# Patient Record
Sex: Female | Born: 1985 | Race: White | Hispanic: No | Marital: Married | Smoking: Never smoker
Health system: Southern US, Community
[De-identification: ages and names within clinical notes are randomized; demographics above are authoritative.]

## PROBLEM LIST (undated history)

## (undated) ENCOUNTER — Inpatient Hospital Stay (HOSPITAL_COMMUNITY): Payer: Self-pay

## (undated) DIAGNOSIS — I959 Hypotension, unspecified: Secondary | ICD-10-CM

## (undated) HISTORY — PX: DILATION AND CURETTAGE OF UTERUS: SHX78

---
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
## 2008-04-16 ENCOUNTER — Encounter (INDEPENDENT_AMBULATORY_CARE_PROVIDER_SITE_OTHER): Payer: Self-pay | Admitting: Obstetrics and Gynecology

## 2008-04-16 ENCOUNTER — Ambulatory Visit: Admission: AD | Admit: 2008-04-16 | Discharge: 2008-04-16 | Payer: Self-pay | Admitting: Obstetrics and Gynecology

## 2009-07-07 ENCOUNTER — Inpatient Hospital Stay (HOSPITAL_COMMUNITY): Admission: AD | Admit: 2009-07-07 | Discharge: 2009-07-07 | Payer: Self-pay | Admitting: Obstetrics and Gynecology

## 2009-07-07 ENCOUNTER — Ambulatory Visit: Payer: Self-pay | Admitting: Obstetrics and Gynecology

## 2009-08-06 ENCOUNTER — Inpatient Hospital Stay (HOSPITAL_COMMUNITY): Admission: AD | Admit: 2009-08-06 | Discharge: 2009-08-09 | Payer: Self-pay | Admitting: Obstetrics and Gynecology

## 2009-08-20 ENCOUNTER — Ambulatory Visit: Admission: RE | Admit: 2009-08-20 | Discharge: 2009-08-20 | Payer: Self-pay | Admitting: Obstetrics and Gynecology

## 2010-07-08 LAB — CBC
HCT: 32 % — ABNORMAL LOW (ref 36.0–46.0)
HCT: 34.5 % — ABNORMAL LOW (ref 36.0–46.0)
Hemoglobin: 10.6 g/dL — ABNORMAL LOW (ref 12.0–15.0)
Hemoglobin: 11.5 g/dL — ABNORMAL LOW (ref 12.0–15.0)
MCHC: 33.1 g/dL (ref 30.0–36.0)
MCHC: 33.3 g/dL (ref 30.0–36.0)
MCV: 79.6 fL (ref 78.0–100.0)
MCV: 80.7 fL (ref 78.0–100.0)
Platelets: 190 10*3/uL (ref 150–400)
Platelets: 217 10*3/uL (ref 150–400)
RBC: 3.97 MIL/uL (ref 3.87–5.11)
RBC: 4.34 MIL/uL (ref 3.87–5.11)
RDW: 14.4 % (ref 11.5–15.5)
RDW: 14.8 % (ref 11.5–15.5)
WBC: 11.3 10*3/uL — ABNORMAL HIGH (ref 4.0–10.5)
WBC: 17.7 10*3/uL — ABNORMAL HIGH (ref 4.0–10.5)

## 2010-07-08 LAB — RPR: RPR Ser Ql: NONREACTIVE

## 2010-07-29 ENCOUNTER — Inpatient Hospital Stay (HOSPITAL_COMMUNITY)
Admission: AD | Admit: 2010-07-29 | Discharge: 2010-07-29 | Disposition: A | Discharge status: Home / Self Care | Payer: BC Managed Care – PPO | Source: Ambulatory Visit | Attending: Obstetrics and Gynecology | Admitting: Obstetrics and Gynecology

## 2010-07-29 DIAGNOSIS — O4700 False labor before 37 completed weeks of gestation, unspecified trimester: Secondary | ICD-10-CM | POA: Insufficient documentation

## 2010-07-29 DIAGNOSIS — O479 False labor, unspecified: Secondary | ICD-10-CM

## 2010-07-29 LAB — URINALYSIS, ROUTINE W REFLEX MICROSCOPIC
Bilirubin Urine: NEGATIVE
Glucose, UA: NEGATIVE mg/dL
Hgb urine dipstick: NEGATIVE
Ketones, ur: NEGATIVE mg/dL
Nitrite: NEGATIVE
Protein, ur: NEGATIVE mg/dL
Specific Gravity, Urine: <1.005 — ABNORMAL LOW (ref 1.005–1.030)
Urobilinogen, UA: 0.2 mg/dL (ref 0.0–1.0)
pH: 5.5 (ref 5.0–8.0)

## 2010-07-29 LAB — FETAL FIBRONECTIN: Fetal Fibronectin: NEGATIVE

## 2010-07-29 LAB — URINE MICROSCOPIC-ADD ON

## 2010-07-31 LAB — URINE CULTURE

## 2010-08-04 ENCOUNTER — Inpatient Hospital Stay (HOSPITAL_COMMUNITY)
Admission: AD | Admit: 2010-08-04 | Discharge: 2010-08-05 | Disposition: A | Discharge status: Home / Self Care | Payer: BC Managed Care – PPO | Source: Ambulatory Visit | Attending: Obstetrics and Gynecology | Admitting: Obstetrics and Gynecology

## 2010-08-04 DIAGNOSIS — N390 Urinary tract infection, site not specified: Secondary | ICD-10-CM

## 2010-08-04 DIAGNOSIS — O2390 Unspecified genitourinary tract infection in pregnancy, unspecified trimester: Secondary | ICD-10-CM | POA: Insufficient documentation

## 2010-08-04 DIAGNOSIS — O4700 False labor before 37 completed weeks of gestation, unspecified trimester: Secondary | ICD-10-CM

## 2010-08-05 ENCOUNTER — Inpatient Hospital Stay (HOSPITAL_COMMUNITY)
Admission: AD | Admit: 2010-08-05 | Discharge: 2010-08-05 | Disposition: A | Discharge status: Home / Self Care | Payer: BC Managed Care – PPO | Source: Ambulatory Visit | Attending: Obstetrics and Gynecology | Admitting: Obstetrics and Gynecology

## 2010-08-05 DIAGNOSIS — O4700 False labor before 37 completed weeks of gestation, unspecified trimester: Secondary | ICD-10-CM | POA: Insufficient documentation

## 2010-08-05 LAB — URINE MICROSCOPIC-ADD ON

## 2010-08-05 LAB — URINALYSIS, ROUTINE W REFLEX MICROSCOPIC
Bilirubin Urine: NEGATIVE
Glucose, UA: NEGATIVE mg/dL
Hgb urine dipstick: NEGATIVE
Specific Gravity, Urine: 1.015 (ref 1.005–1.030)
pH: 6.5 (ref 5.0–8.0)

## 2010-08-06 ENCOUNTER — Inpatient Hospital Stay (HOSPITAL_COMMUNITY)
Admission: AD | Admit: 2010-08-06 | Discharge: 2010-08-07 | Disposition: A | Discharge status: Home / Self Care | Payer: BC Managed Care – PPO | Source: Ambulatory Visit | Attending: Obstetrics and Gynecology | Admitting: Obstetrics and Gynecology

## 2010-08-06 DIAGNOSIS — O4700 False labor before 37 completed weeks of gestation, unspecified trimester: Secondary | ICD-10-CM

## 2010-08-06 DIAGNOSIS — R109 Unspecified abdominal pain: Secondary | ICD-10-CM | POA: Insufficient documentation

## 2010-08-06 LAB — URINE CULTURE

## 2010-08-29 ENCOUNTER — Inpatient Hospital Stay (HOSPITAL_COMMUNITY)
Admission: AD | Admit: 2010-08-29 | Discharge: 2010-08-29 | Disposition: A | Discharge status: Home / Self Care | Payer: BC Managed Care – PPO | Source: Ambulatory Visit | Attending: Obstetrics and Gynecology | Admitting: Obstetrics and Gynecology

## 2010-08-29 DIAGNOSIS — O479 False labor, unspecified: Secondary | ICD-10-CM | POA: Insufficient documentation

## 2010-09-02 NOTE — Op Note (Signed)
NAME:  Emily Bean, Emily Bean NO.:  [id number]   MEDICAL RECORD NO.:  [id number]          PATIENT TYPE:  AMB   LOCATION:  DFT[facility]                          FACILITY:  [facility]   PHYSICIAN:  Juluis Mire, M.D.   DATE OF BIRTH:  [birth year]   DATE OF PROCEDURE:  DATE OF DISCHARGE:                               OPERATIVE REPORT   PREOPERATIVE DIAGNOSIS:  Incomplete spontaneous abortion.   POSTOPERATIVE DIAGNOSIS:  Incomplete spontaneous abortion.   PROCEDURE:  Dilatation and evacuation.   ANESTHESIA:  Sedation with paracervical block.   ESTIMATED BLOOD LOSS:  Minimal.   PACKS AND DRAINS:  None.   INTRAOPERATIVE BLOOD REPLACED:  None.   COMPLICATIONS:  None.   INDICATIONS:  A 25 year old primigravida female, had been seen in the  office over the past week with bleeding.  Initial ultrasound revealed a  fetal pole with a slow heart activity.  She was seen again today with a  fetal pole with no heart activity, was set up and D&E on Wednesday.  She  presented tonight with increasing bleeding and cramping.  She was having  moderate bleeding.  Decision was proceed with dilatation and evacuation.  The risks were explained, including the risks of infection.  The risk of  vascular injury could lead to hemorrhage requiring transfusion with the  risk of AIDS or hepatitis.  Excessive bleeding could require  hysterectomy.  Also continued bleeding could be from retained products  requiring repeat D&C.  There is a risk of uterine perforation leading to  injury to adjacent __________ organs requiring further exploratory  surgery.  Risk of deep venous thrombosis and pulmonary emboli.  The  patient does understand potential risks and complications.   DESCRIPTION OF PROCEDURE:  The patient was taken to OR, placed supine  position.  After sedation, was placed in dorsal lying position using  Allen stirrups.  The patient draped in sterile field.  Speculum was  placed in vaginal vault.  Cervix  and vagina cleansed with Betadine.  Moderate tissue was seen extruding from the cervix.  It was removed and  sent for Pathology.  Cervix grasped with single tooth tenaculum.  Paracervical blocks ensued using 1% __________.  Uterus sounded to about  9 cm.  Cervix is already dilated.  Size 8 curved cut.  Suction curette  was introduced and minimal tissue was obtained.  It was felt that most  of tissue had already passed is noted on initial exam.  We sharply  curetted the uterus.  Uterus was contracting down a lot well.  All  quadrants were gritty.  There was no evidence of retained products.  Repeat suction curetting, sharp curetting again no additional tissue was  obtained and the uterus was contracting down well.  No perforation had  occurred.  Single tooth tenaculum speculum removed.  The patient taken  out of the dorsal position, once alert, transferred recovery room in  good condition.  Sponge, instrument, needle count reported as correct by  circulating nurse.      Juluis Mire, M.D.  Electronically Signed     JSM/MEDQ  D:  04/16/2008  T:  04/17/2008  Job:  [id number]

## 2010-09-07 ENCOUNTER — Inpatient Hospital Stay (HOSPITAL_COMMUNITY)
Admission: AD | Admit: 2010-09-07 | Discharge: 2010-09-10 | DRG: 373 | Disposition: A | Discharge status: Home / Self Care | Payer: BC Managed Care – PPO | Source: Ambulatory Visit | Attending: Obstetrics and Gynecology | Admitting: Obstetrics and Gynecology

## 2010-09-07 DIAGNOSIS — O3660X0 Maternal care for excessive fetal growth, unspecified trimester, not applicable or unspecified: Principal | ICD-10-CM | POA: Diagnosis present

## 2010-09-07 LAB — CBC
HCT: 34.1 % — ABNORMAL LOW (ref 36.0–46.0)
Hemoglobin: 11.1 g/dL — ABNORMAL LOW (ref 12.0–15.0)
MCH: 24.6 pg — ABNORMAL LOW (ref 26.0–34.0)
MCHC: 32.6 g/dL (ref 30.0–36.0)
RBC: 4.51 MIL/uL (ref 3.87–5.11)

## 2010-09-09 LAB — CBC
HCT: 31.4 % — ABNORMAL LOW (ref 36.0–46.0)
Hemoglobin: 10 g/dL — ABNORMAL LOW (ref 12.0–15.0)
MCHC: 31.8 g/dL (ref 30.0–36.0)
MCV: 76.6 fL — ABNORMAL LOW (ref 78.0–100.0)
RDW: 15.3 % (ref 11.5–15.5)
WBC: 12.6 10*3/uL — ABNORMAL HIGH (ref 4.0–10.5)

## 2011-01-23 LAB — CBC
Hemoglobin: 14.4 g/dL (ref 12.0–15.0)
MCHC: 34.4 g/dL (ref 30.0–36.0)
MCV: 88.4 fL (ref 78.0–100.0)
RDW: 13.2 % (ref 11.5–15.5)

## 2011-02-11 ENCOUNTER — Emergency Department (HOSPITAL_COMMUNITY)
Admission: EM | Admit: 2011-02-11 | Discharge: 2011-02-11 | Disposition: A | Discharge status: Home / Self Care | Payer: BC Managed Care – PPO | Attending: Emergency Medicine | Admitting: Emergency Medicine

## 2011-02-11 DIAGNOSIS — R110 Nausea: Secondary | ICD-10-CM | POA: Insufficient documentation

## 2011-02-11 DIAGNOSIS — R42 Dizziness and giddiness: Secondary | ICD-10-CM | POA: Insufficient documentation

## 2011-02-11 DIAGNOSIS — E860 Dehydration: Secondary | ICD-10-CM | POA: Insufficient documentation

## 2011-02-11 LAB — POCT I-STAT, CHEM 8
BUN: 21 mg/dL (ref 6–23)
Calcium, Ion: 1.08 mmol/L — ABNORMAL LOW (ref 1.12–1.32)
Chloride: 107 mEq/L (ref 96–112)
Creatinine, Ser: 0.8 mg/dL (ref 0.50–1.10)
Glucose, Bld: 92 mg/dL (ref 70–99)
Potassium: 4.9 mEq/L (ref 3.5–5.1)

## 2011-02-11 LAB — URINALYSIS, ROUTINE W REFLEX MICROSCOPIC
Bilirubin Urine: NEGATIVE
Glucose, UA: NEGATIVE mg/dL
Hgb urine dipstick: NEGATIVE
Ketones, ur: NEGATIVE mg/dL
Nitrite: NEGATIVE
Specific Gravity, Urine: 1.004 — ABNORMAL LOW (ref 1.005–1.030)
pH: 6 (ref 5.0–8.0)

## 2011-02-11 LAB — POCT PREGNANCY, URINE: Preg Test, Ur: NEGATIVE

## 2012-01-29 ENCOUNTER — Inpatient Hospital Stay (HOSPITAL_COMMUNITY)
Admission: AD | Admit: 2012-01-29 | Discharge: 2012-01-29 | Disposition: A | Discharge status: Home / Self Care | Payer: BC Managed Care – PPO | Source: Ambulatory Visit | Attending: Family Medicine | Admitting: Family Medicine

## 2012-01-29 ENCOUNTER — Encounter (HOSPITAL_COMMUNITY): Payer: Self-pay

## 2012-01-29 DIAGNOSIS — R51 Headache: Secondary | ICD-10-CM | POA: Insufficient documentation

## 2012-01-29 DIAGNOSIS — R519 Headache, unspecified: Secondary | ICD-10-CM | POA: Diagnosis present

## 2012-01-29 DIAGNOSIS — A084 Viral intestinal infection, unspecified: Secondary | ICD-10-CM | POA: Diagnosis present

## 2012-01-29 DIAGNOSIS — O26899 Other specified pregnancy related conditions, unspecified trimester: Secondary | ICD-10-CM

## 2012-01-29 DIAGNOSIS — O99891 Other specified diseases and conditions complicating pregnancy: Secondary | ICD-10-CM | POA: Insufficient documentation

## 2012-01-29 DIAGNOSIS — O210 Mild hyperemesis gravidarum: Secondary | ICD-10-CM | POA: Insufficient documentation

## 2012-01-29 DIAGNOSIS — A088 Other specified intestinal infections: Secondary | ICD-10-CM | POA: Insufficient documentation

## 2012-01-29 HISTORY — DX: Hypotension, unspecified: I95.9

## 2012-01-29 LAB — URINALYSIS, ROUTINE W REFLEX MICROSCOPIC
Bilirubin Urine: NEGATIVE
Glucose, UA: NEGATIVE mg/dL
Hgb urine dipstick: NEGATIVE
Ketones, ur: NEGATIVE mg/dL
Leukocytes, UA: NEGATIVE
Nitrite: NEGATIVE
Protein, ur: NEGATIVE mg/dL
Specific Gravity, Urine: 1.015 (ref 1.005–1.030)
Urobilinogen, UA: 0.2 mg/dL (ref 0.0–1.0)
pH: 6 (ref 5.0–8.0)

## 2012-01-29 LAB — COMPREHENSIVE METABOLIC PANEL
ALT: 22 U/L (ref 0–35)
Alkaline Phosphatase: 53 U/L (ref 39–117)
BUN: 7 mg/dL (ref 6–23)
CO2: 24 mEq/L (ref 19–32)
Calcium: 8.5 mg/dL (ref 8.4–10.5)
GFR calc Af Amer: >90 mL/min (ref >90)
GFR calc non Af Amer: >90 mL/min (ref >90)
Glucose, Bld: 90 mg/dL (ref 70–99)
Sodium: 139 mEq/L (ref 135–145)
Total Protein: 6.1 g/dL (ref 6.0–8.3)

## 2012-01-29 LAB — CBC
HCT: 35.9 % — ABNORMAL LOW (ref 36.0–46.0)
Hemoglobin: 12.4 g/dL (ref 12.0–15.0)
MCH: 28.4 pg (ref 26.0–34.0)
MCHC: 34.5 g/dL (ref 30.0–36.0)
RBC: 4.37 MIL/uL (ref 3.87–5.11)

## 2012-01-29 MED ORDER — BUTALBITAL-APAP-CAFFEINE 50-325-40 MG PO TABS
2.0000 | ORAL_TABLET | ORAL | Status: AC
  Administered 2012-01-29: 2 via ORAL
  Filled 2012-01-29: qty 2

## 2012-01-29 MED ORDER — LACTATED RINGERS IV BOLUS (SEPSIS)
1000.0000 mL | Freq: Once | INTRAVENOUS | Status: AC
  Administered 2012-01-29: 1000 mL via INTRAVENOUS

## 2012-01-29 MED ORDER — BUTALBITAL-APAP-CAFFEINE 50-325-40 MG PO TABS: 1.0000 | ORAL_TABLET | Freq: Four times a day (QID) | ORAL | Status: AC | PRN

## 2012-01-29 NOTE — MAU Provider Note (Signed)
Chief Complaint: Emesis and Headache   First Provider Initiated Contact with Patient 01/29/12 1254     SUBJECTIVE HPI: Emily Bean is a 26 y.o. [id number] pt of [facility] at [redacted]w[redacted]d by LMP who presents to maternity admissions reporting diarrhea and n/v x3 days, improving today but dizziness, ears ringing, and severe headache starting today.  She denies LOF, vaginal bleeding, vaginal itching/burning, urinary symptoms, h/a, dizziness, or fever/chills.     Past Medical History  Diagnosis Date  . Hypotension    Past Surgical History  Procedure Date  . Dilation and curettage of uterus    History   Social History  . Marital Status: Married    Spouse Name: N/A    Number of Children: N/A  . Years of Education: N/A   Occupational History  . Not on file.   Social History Main Topics  . Smoking status: Never Smoker   . Smokeless tobacco: Not on file  . Alcohol Use: No  . Drug Use: No  . Sexually Active: Yes   Other Topics Concern  . Not on file   Social History Narrative  . No narrative on file   No current facility-administered medications on file prior to encounter.   No current outpatient prescriptions on file prior to encounter.   Allergies  Allergen Reactions  . Vicodin (Hydrocodone-Acetaminophen) Hives, Swelling and Rash    ROS: Pertinent items in HPI  OBJECTIVE Blood pressure 105/72, pulse 114, temperature 99.2 F (37.3 C), temperature source Oral, resp. rate 16, height 5' 2.5" (1.588 m), weight 63.685 kg (140 lb 6.4 oz), SpO2 100.00%. GENERAL: Well-developed, well-nourished female in no acute distress.  HEENT: Normocephalic HEART: normal rate, no abnormal heart sounds RESP: normal effort, lung sounds clear and equal bilaterally ABDOMEN: Soft, non-tender EXTREMITIES: Nontender, no edema NEURO: Alert and oriented SPECULUM EXAM: Deferred   LAB RESULTS Recent Results (from the past 168 hour(s))  URINALYSIS, ROUTINE W REFLEX MICROSCOPIC   Collection Time   01/29/12 12:04 PM      Component Value Range   Color, Urine YELLOW  YELLOW   APPearance CLEAR  CLEAR   Specific Gravity, Urine 1.015  1.005 - 1.030   pH 6.0  5.0 - 8.0   Glucose, UA NEGATIVE  NEGATIVE mg/dL   Hgb urine dipstick NEGATIVE  NEGATIVE   Bilirubin Urine NEGATIVE  NEGATIVE   Ketones, ur NEGATIVE  NEGATIVE mg/dL   Protein, ur NEGATIVE  NEGATIVE mg/dL   Urobilinogen, UA 0.2  0.0 - 1.0 mg/dL   Nitrite NEGATIVE  NEGATIVE   Leukocytes, UA NEGATIVE  NEGATIVE  CBC   Collection Time   01/29/12  1:10 PM      Component Value Range   WBC 6.6  4.0 - 10.5 K/uL   RBC 4.37  3.87 - 5.11 MIL/uL   Hemoglobin 12.4  12.0 - 15.0 g/dL   HCT 45.4 (*) 09.8 - 11.9 %   MCV 82.2  78.0 - 100.0 fL   MCH 28.4  26.0 - 34.0 pg   MCHC 34.5  30.0 - 36.0 g/dL   RDW 14.7  82.9 - 56.2 %   Platelets 173  150 - 400 K/uL  COMPREHENSIVE METABOLIC PANEL   Collection Time   01/29/12  1:10 PM      Component Value Range   Sodium 139  135 - 145 mEq/L   Potassium 4.3  3.5 - 5.1 mEq/L   Chloride 103  96 - 112 mEq/L   CO2 24  19 -  32 mEq/L   Glucose, Bld 90  70 - 99 mg/dL   BUN 7  6 - 23 mg/dL   Creatinine, Ser 9.56  0.50 - 1.10 mg/dL   Calcium 8.5  8.4 - 21.3 mg/dL   Total Protein 6.1  6.0 - 8.3 g/dL   Albumin 3.2 (*) 3.5 - 5.2 g/dL   AST 20  0 - 37 U/L   ALT 22  0 - 35 U/L   Alkaline Phosphatase 53  39 - 117 U/L   Total Bilirubin 0.3  0.3 - 1.2 mg/dL   GFR calc non Af Amer >90  >90 mL/min   GFR calc Af Amer >90  >90 mL/min     ASSESSMENT 1. Viral gastroenteritis   2. Headache in pregnancy     PLAN LR x2 and Fioricet x2 tabs in MAU--h/a relieved, pt still reports dizziness so second bag of 1000 ml LR given--pt symptoms resolved Discharge home Fioricet 1-2 tabs Q6 hours PRN for h/a F/U with prenatal provider Drink plenty of fluids Return to MAU as needed    Sharen Counter Certified Nurse-Midwife 01/29/2012  12:56 PM

## 2012-01-29 NOTE — MAU Note (Signed)
Patient states she woke up on 10-9 with diarrhea and vomiting. Thought it was from food. Had one episode of vomiting yesterday but none today. Has a headache and ears ringing. Feels dehydrated. Called her MD in [facility] and was instructed to come her for fluids.

## 2012-02-01 NOTE — MAU Provider Note (Signed)
Chart reviewed and agree with management and plan.  

## 2012-12-03 ENCOUNTER — Encounter (HOSPITAL_COMMUNITY): Payer: Self-pay | Admitting: *Deleted

## 2014-02-19 ENCOUNTER — Encounter (HOSPITAL_COMMUNITY): Payer: Self-pay | Admitting: *Deleted

## 2019-03-20 ENCOUNTER — Other Ambulatory Visit: Payer: Self-pay

## 2019-03-20 DIAGNOSIS — Z20822 Contact with and (suspected) exposure to covid-19: Secondary | ICD-10-CM

## 2019-03-21 LAB — NOVEL CORONAVIRUS, NAA: SARS-CoV-2, NAA: NOT DETECTED

## 2019-11-07 ENCOUNTER — Ambulatory Visit (INDEPENDENT_AMBULATORY_CARE_PROVIDER_SITE_OTHER): Payer: Self-pay

## 2019-11-07 ENCOUNTER — Other Ambulatory Visit: Payer: Self-pay

## 2019-11-07 ENCOUNTER — Ambulatory Visit (INDEPENDENT_AMBULATORY_CARE_PROVIDER_SITE_OTHER): Payer: Self-pay | Admitting: Orthopaedic Surgery

## 2019-11-07 ENCOUNTER — Encounter: Payer: Self-pay | Admitting: Orthopaedic Surgery

## 2019-11-07 DIAGNOSIS — M25562 Pain in left knee: Secondary | ICD-10-CM

## 2019-11-07 MED ORDER — METHYLPREDNISOLONE ACETATE 40 MG/ML IJ SUSP
40.0000 mg | INTRAMUSCULAR | Status: AC | PRN
  Administered 2019-11-07: 40 mg via INTRA_ARTICULAR

## 2019-11-07 MED ORDER — LIDOCAINE HCL 1 % IJ SOLN
3.0000 mL | INTRAMUSCULAR | Status: AC | PRN
Start: 2019-11-07 — End: 2019-11-07
  Administered 2019-11-07: 3 mL

## 2019-11-07 NOTE — Progress Notes (Signed)
Office Visit Note   Patient: Emily Bean           Date of Birth: 12/11/1985           MRN: [id number] Visit Date: 11/07/2019              Requested by: No referring provider defined for this encounter. PCP: Patient, No Pcp Per   Assessment & Plan: Visit Diagnoses:  1. Acute pain of left knee     Plan: We will obtain an MRI of her knee due to the fact that she continues to have mechanical symptoms of the knee been ongoing for greater than 6 months.  MRIs to rule out a meniscal tear.  She will follow-up after the MRI to go over results and discuss further treatment.  Follow-Up Instructions: Return After MRi.   Orders:  Orders Placed This Encounter  Procedures   Large Joint Inj   XR Knee 1-2 Views Left   No orders of the defined types were placed in this encounter.     Procedures: Large Joint Inj: L knee on 11/07/2019 2:30 PM Indications: pain Details: 22 G 1.5 in needle, anterolateral approach  Arthrogram: No  Medications: 3 mL lidocaine 1 %; 40 mg methylPREDNISolone acetate 40 MG/ML Outcome: tolerated well, no immediate complications Procedure, treatment alternatives, risks and benefits explained, specific risks discussed. Consent was given by the patient. Immediately prior to procedure a time out was called to verify the correct patient, procedure, equipment, support staff and site/side marked as required. Patient was prepped and draped in the usual sterile fashion.       Clinical Data: No additional findings.   Subjective: Chief Complaint  Patient presents with   Left Knee - Pain, Injury    HPI Patient is a 34 year old female comes in today with left knee pain.  She reports she slipped on ice back in February of this year and injured the knee describes having valgus stressing of the knee at the time of fall.  She states she continues have pain in the anterior aspect knee and medial joint line region.  She cannot jog or run for exercise due to pain  in the knee.  She unfortunately had a fall 2 weeks ago and whenever she fell in the kitchen while dancing and again valgus stress the knee with follow-up.  She has constant achy pain in the knee she describes no giving way catching or painful popping.  She does have occasional locking-like sensation in the knee.  She has tried Advil and relative rest.  She had no significant swelling in the knee.  Review of Systems  Constitutional: Negative for chills and fever.     Objective: Vital Signs: There were no vitals taken for this visit.  Physical Exam Constitutional:      Appearance: She is not ill-appearing or diaphoretic.  Pulmonary:     Effort: Pulmonary effort is normal.  Neurological:     Mental Status: She is alert and oriented to person, place, and time.  Psychiatric:        Behavior: Behavior normal.     Ortho Exam Left knee: No effusion abnormal warmth or erythema.  Good range of motion left knee tenderness along medial joint line.  Positive McMurray's.  No instability valgus varus stressing. Specialty Comments:  No specialty comments available.  Imaging: XR Knee 1-2 Views Left  Result Date: 11/07/2019 Left knee 2 views: No acute fracture or findings. Knee joint well maintained and knee located.  PMFS History: Patient Active Problem List   Diagnosis Date Noted   Viral gastroenteritis 01/29/2012   Headache in pregnancy 01/29/2012   Past Medical History:  Diagnosis Date   Hypotension     History reviewed. No pertinent family history.  Past Surgical History:  Procedure Laterality Date   DILATION AND CURETTAGE OF UTERUS     Social History   Occupational History   Not on file  Tobacco Use   Smoking status: Never Smoker  Substance and Sexual Activity   Alcohol use: No   Drug use: No   Sexual activity: Yes

## 2019-11-09 ENCOUNTER — Telehealth: Payer: Self-pay | Admitting: Physician Assistant

## 2019-11-09 ENCOUNTER — Other Ambulatory Visit: Payer: Self-pay

## 2019-11-09 DIAGNOSIS — M25562 Pain in left knee: Secondary | ICD-10-CM

## 2019-11-09 NOTE — Telephone Encounter (Signed)
Pt called stating she got a knee injection 11/06/19 and now the pain is x10 worse; pt also states her knee has been locking up. Pt would like a CB  [phone]

## 2019-11-09 NOTE — Telephone Encounter (Signed)
IC s/w patient. Advised she should try ice/anti-inflammatory and elevate. She will follow up with [place] on Monday if no better.

## 2019-11-13 ENCOUNTER — Ambulatory Visit
Admission: RE | Admit: 2019-11-13 | Discharge: 2019-11-13 | Disposition: A | Discharge status: Home / Self Care | Payer: No Typology Code available for payment source | Source: Ambulatory Visit | Attending: Physician Assistant | Admitting: Physician Assistant

## 2019-11-13 ENCOUNTER — Other Ambulatory Visit: Payer: Self-pay

## 2019-11-13 ENCOUNTER — Telehealth: Payer: Self-pay | Admitting: Physician Assistant

## 2019-11-13 DIAGNOSIS — M25562 Pain in left knee: Secondary | ICD-10-CM

## 2019-11-13 NOTE — Telephone Encounter (Signed)
Patient called.   She is wanting to know if she can get a call back with some advice on how to maintain until her MRI review. Her MRI is being done today.  Call back: [phone]

## 2019-11-14 ENCOUNTER — Other Ambulatory Visit: Payer: Self-pay | Admitting: Orthopaedic Surgery

## 2019-11-14 NOTE — Telephone Encounter (Signed)
Patient aware of the below message  She states that she didn't ask for anything or say she was in pain, she just asked if someone was going to call her before the MRI appt, I told her that we don't call the patients with results, we go over them at her appt and she is ok with this

## 2019-11-14 NOTE — Telephone Encounter (Signed)
There is really nothing else she can do right now.  She can try a knee sleeve over-the-counter.  She can take anti-inflammatories over-the-counter as well.  I was going to send in some tramadol but it list serious allergies to narcotics including hives and swelling and so that prohibits [place] from sending any stronger pain medicine.  Certainly activity modification and ice of the knee can help as well.

## 2019-11-14 NOTE — Telephone Encounter (Signed)
See below Her followup appt is 11/22/19

## 2019-11-22 ENCOUNTER — Ambulatory Visit (INDEPENDENT_AMBULATORY_CARE_PROVIDER_SITE_OTHER): Payer: Self-pay | Admitting: Physician Assistant

## 2019-11-22 ENCOUNTER — Encounter: Payer: Self-pay | Admitting: Physician Assistant

## 2019-11-22 DIAGNOSIS — M25562 Pain in left knee: Secondary | ICD-10-CM

## 2019-11-22 MED ORDER — METHYLPREDNISOLONE 4 MG PO TABS: ORAL_TABLET | ORAL | 0 refills | Status: AC

## 2019-11-22 NOTE — Progress Notes (Addendum)
Office Visit Note   Patient: Emily Bean           Date of Birth: 01-08-1986           MRN: [id number] Visit Date: 11/22/2019              Requested by: Porfirio Oar, PA [address] [place],  [place] [address] PCP: Porfirio Oar, PA   Assessment & Plan: Visit Diagnoses:  1. Acute pain of left knee     Plan: We will place her on a Medrol Dosepak.  Then send her to formal physical therapy for strengthening of the knee modalities and a home exercise program.  No high impact activities.  Like to see her back in 6 weeks to see what type of response she has had to this conservative treatment.  She may benefit from a repeat MRI to evaluate the lateral posterior femoral condyle edema.  She fails conservative treatment may consider subchondroplasty.  Questions were encouraged and answered.No high impact activities.   Follow-Up Instructions: Return in about 6 weeks (around 01/03/2020).   Orders:  No orders of the defined types were placed in this encounter.  Meds ordered this encounter  Medications  . methylPREDNISolone (MEDROL) 4 MG tablet    Sig: Take as directed    Dispense:  21 tablet    Refill:  0      Procedures: No procedures performed   Clinical Data: No additional findings.   Subjective: Chief Complaint  Patient presents with  . Left Knee - Follow-up    HPI Mrs. Sutphen returns today to go over the MRI of her left knee.  She states the cortisone injection actually made her pain worse.  She states for about 5 days knee after the injection the knee felt like it was locking up.  She is having pain in the anterior aspect of the knee and the posterior lateral aspect of the ankle. MRI images are reviewed with the patient.  This shows some mild patella chondromalacia.  Large area of focal edema involving the posterior aspect of the lateral femoral condyle focal bony contusion.  No ligamentous or meniscal tears.  Review of Systems See  HPI.  Objective: Vital Signs: There were no vitals taken for this visit.  Physical Exam Constitutional:      Appearance: She is not ill-appearing or diaphoretic.  Pulmonary:     Effort: Pulmonary effort is normal.  Neurological:     Mental Status: She is alert and oriented to person, place, and time.  Psychiatric:        Mood and Affect: Mood normal.     Ortho Exam Physical exam left knee good range of motion.  Tenderness along the lateral joint line posteriorly.  Also some tenderness over the anterior medial aspect of the knee with palpation.  No abnormal warmth erythema or effusion. Specialty Comments:  No specialty comments available.  Imaging: No results found.   PMFS History: Patient Active Problem List   Diagnosis Date Noted  . Viral gastroenteritis 01/29/2012  . Headache in pregnancy 01/29/2012   Past Medical History:  Diagnosis Date  . Hypotension     History reviewed. No pertinent family history.  Past Surgical History:  Procedure Laterality Date  . DILATION AND CURETTAGE OF UTERUS     Social History   Occupational History  . Not on file  Tobacco Use  . Smoking status: Never Smoker  Substance and Sexual Activity  . Alcohol use: No  .  Drug use: No  . Sexual activity: Yes

## 2019-11-23 ENCOUNTER — Other Ambulatory Visit: Payer: Self-pay

## 2019-12-10 ENCOUNTER — Encounter (HOSPITAL_COMMUNITY): Payer: Self-pay

## 2019-12-10 ENCOUNTER — Emergency Department (HOSPITAL_COMMUNITY)
Admission: EM | Admit: 2019-12-10 | Discharge: 2019-12-10 | Disposition: A | Discharge status: Home / Self Care | Payer: Self-pay | Attending: Emergency Medicine | Admitting: Emergency Medicine

## 2019-12-10 ENCOUNTER — Other Ambulatory Visit: Payer: Self-pay

## 2019-12-10 DIAGNOSIS — T171XXA Foreign body in nostril, initial encounter: Secondary | ICD-10-CM | POA: Insufficient documentation

## 2019-12-10 DIAGNOSIS — Z91040 Latex allergy status: Secondary | ICD-10-CM | POA: Insufficient documentation

## 2019-12-10 DIAGNOSIS — Y929 Unspecified place or not applicable: Secondary | ICD-10-CM | POA: Insufficient documentation

## 2019-12-10 DIAGNOSIS — W458XXA Other foreign body or object entering through skin, initial encounter: Secondary | ICD-10-CM | POA: Insufficient documentation

## 2019-12-10 DIAGNOSIS — Y939 Activity, unspecified: Secondary | ICD-10-CM | POA: Insufficient documentation

## 2019-12-10 DIAGNOSIS — Y999 Unspecified external cause status: Secondary | ICD-10-CM | POA: Insufficient documentation

## 2019-12-10 NOTE — ED Provider Notes (Signed)
[facility] EMERGENCY DEPARTMENT Provider Note   CSN: [id number] Arrival date & time: 12/10/19  1238     History No chief complaint on file.   Emily Bean is a 34 y.o. female.  The history is provided by the patient.  Foreign Body in Nose This is a new problem. The current episode started yesterday. The problem occurs rarely. The problem has not changed since onset.Pertinent negatives include no chest pain, no abdominal pain, no headaches and no shortness of breath. She has tried nothing for the symptoms. The treatment provided no relief.       Past Medical History:  Diagnosis Date  . Hypotension     Patient Active Problem List   Diagnosis Date Noted  . Viral gastroenteritis 01/29/2012  . Headache in pregnancy 01/29/2012    Past Surgical History:  Procedure Laterality Date  . DILATION AND CURETTAGE OF UTERUS       OB History    Gravida  5   Para  2   Term  2   Preterm      AB  2   Living  2     SAB  2   TAB      Ectopic      Multiple      Live Births              No family history on file.  Social History   Tobacco Use  . Smoking status: Never Smoker  Substance Use Topics  . Alcohol use: No  . Drug use: No    Home Medications Prior to Admission medications   Medication Sig Start Date End Date Taking? Authorizing Provider  busPIRone (BUSPAR) 5 MG tablet Take by mouth. 10/30/19   [provider]  meclizine (ANTIVERT) 25 MG tablet Take 12.5 mg by mouth daily as needed. For dizziness    [provider]  methylPREDNISolone (MEDROL) 4 MG tablet Take as directed 11/22/19   Kirtland Bouchard, PA-C    Allergies    Latex, Procardia [nifedipine], and Vicodin [hydrocodone-acetaminophen]  Review of Systems   Review of Systems  Constitutional: Negative for chills and fever.  HENT: Positive for sinus pressure. Negative for ear pain and sore throat.   Eyes: Negative for pain and visual disturbance.    Respiratory: Negative for cough and shortness of breath.   Cardiovascular: Negative for chest pain and palpitations.  Gastrointestinal: Negative for abdominal pain and vomiting.  Genitourinary: Negative for dysuria and hematuria.  Musculoskeletal: Negative for arthralgias and back pain.  Skin: Negative for color change and rash.  Neurological: Negative for seizures, syncope and headaches.  All other systems reviewed and are negative.   Physical Exam Updated Vital Signs BP 119/66   Pulse 87   Temp 98.4 F (36.9 C) (Oral)   Resp 17   Ht 5\' 2"  (1.575 m)   Wt 72.6 kg   SpO2 100%   BMI 29.26 kg/m   Physical Exam Vitals and nursing note reviewed.  Constitutional:      General: She is not in acute distress.    Appearance: She is well-developed.  HENT:     Head: Normocephalic and atraumatic.     Nose:     Comments: Mucous in R posterior nare but no obvious FB.  L nare clear. Eyes:     Conjunctiva/sclera: Conjunctivae normal.  Cardiovascular:     Rate and Rhythm: Normal rate and regular rhythm.     Heart sounds: No murmur  heard.   Pulmonary:     Effort: Pulmonary effort is normal. No respiratory distress.     Breath sounds: Normal breath sounds.  Abdominal:     Palpations: Abdomen is soft.     Tenderness: There is no abdominal tenderness.  Musculoskeletal:     Cervical back: Neck supple.  Skin:    General: Skin is warm and dry.  Neurological:     General: No focal deficit present.     Mental Status: She is alert and oriented to person, place, and time. Mental status is at baseline.     Cranial Nerves: No cranial nerve deficit.     Sensory: No sensory deficit.  Psychiatric:        Mood and Affect: Mood normal.        Behavior: Behavior normal.     ED Results / Procedures / Treatments   Labs (all labs ordered are listed, but only abnormal results are displayed) Labs Reviewed - No data to display  EKG None  Radiology No results  found.  Procedures Procedures (including critical care time)  Medications Ordered in ED Medications - No data to display  ED Course  I have reviewed the triage vital signs and the nursing notes.  Pertinent labs & imaging results that were available during my care of the patient were reviewed by me and considered in my medical decision making (see chart for details).  Clinical Course as of Dec 10 1931  Wynelle Link Dec 10, 2019  5361 Discussed case with ENT who recommended that patient keep flushing out her nose with saline and stated that he would see her in the office soon.  Do not feel the patient required scope or imaging at this time.  This was communicated to the patient who voiced agreement and understand the overall plan.  Patient was discharged in stable condition without further events.   [CW]    Clinical Course User Index [CW] Rickey Primus, MD   MDM Rules/Calculators/A&P                          34 year old female with no relevant past medical history who presents with a foreign body in her right nare.  Patient states that she was taking supplements last night when she felt that one of her pills accidentally went up into her nose rather than being swallowed.  Since then patient has had a sensation of a foreign body in her right nare as well as sinus congestion and pressure.  Patient has tried blowing her nose as well as shooting saline into her right nose feels like she has dislodged several pieces of the pill however states that she still has the sensation of a foreign body.  Denies any other symptoms including fevers, chills, chest pain, shortness of breath.  Afebrile vital signs stable.  Exam as above.  Exam most notable for mucus in the right posterior nare.  Left nare clear.  No obvious foreign body noted in either nare.  Posterior pharynx clear with no abnormal findings.  Will consult ENT.    Clinical Course as of Dec 10 1931  Wynelle Link Dec 10, 2019  4431 Discussed case with ENT  who recommended that patient keep flushing out her nose with saline and stated that he would see her in the office soon.  Do not feel the patient required scope or imaging at this time.  This was communicated to the patient who voiced agreement and understand the  overall plan.  Patient was discharged in stable condition without further events.   [CW]    Clinical Course User Index [CW] Rickey Primus, MD    Final Clinical Impression(s) / ED Diagnoses Final diagnoses:  Foreign body in nose, initial encounter    Rx / DC Orders ED Discharge Orders    None       Rickey Primus, MD 12/10/19 1934    Melene Plan, DO 12/10/19 2116

## 2019-12-10 NOTE — Discharge Instructions (Signed)
Keep flushing the nose out with saline and you will be contacted to follow-up with ENT.  I have attached the number if you do not hear from them tomorrow please give them a call to follow-up.

## 2019-12-10 NOTE — ED Triage Notes (Signed)
Patient complains of right nare/sinus pressure after po supplement went up nose instead of down her throat. patient has tried to flush and blow supplement out with no success. Complaining of sinus pressure

## 2020-02-17 ENCOUNTER — Other Ambulatory Visit: Payer: Self-pay

## 2020-02-17 DIAGNOSIS — Z20822 Contact with and (suspected) exposure to covid-19: Secondary | ICD-10-CM

## 2020-02-18 ENCOUNTER — Encounter (HOSPITAL_BASED_OUTPATIENT_CLINIC_OR_DEPARTMENT_OTHER): Payer: Self-pay | Admitting: Emergency Medicine

## 2020-02-18 ENCOUNTER — Other Ambulatory Visit: Payer: Self-pay

## 2020-02-18 ENCOUNTER — Emergency Department (HOSPITAL_BASED_OUTPATIENT_CLINIC_OR_DEPARTMENT_OTHER)
Admission: EM | Admit: 2020-02-18 | Discharge: 2020-02-18 | Disposition: A | Discharge status: Home / Self Care | Payer: Self-pay | Attending: Emergency Medicine | Admitting: Emergency Medicine

## 2020-02-18 DIAGNOSIS — Z91040 Latex allergy status: Secondary | ICD-10-CM | POA: Insufficient documentation

## 2020-02-18 DIAGNOSIS — O09511 Supervision of elderly primigravida, first trimester: Secondary | ICD-10-CM | POA: Insufficient documentation

## 2020-02-18 DIAGNOSIS — J069 Acute upper respiratory infection, unspecified: Secondary | ICD-10-CM | POA: Insufficient documentation

## 2020-02-18 DIAGNOSIS — O99511 Diseases of the respiratory system complicating pregnancy, first trimester: Secondary | ICD-10-CM | POA: Insufficient documentation

## 2020-02-18 DIAGNOSIS — Z3A09 9 weeks gestation of pregnancy: Secondary | ICD-10-CM | POA: Insufficient documentation

## 2020-02-18 DIAGNOSIS — O26891 Other specified pregnancy related conditions, first trimester: Secondary | ICD-10-CM | POA: Insufficient documentation

## 2020-02-18 DIAGNOSIS — R110 Nausea: Secondary | ICD-10-CM | POA: Insufficient documentation

## 2020-02-18 LAB — CBC WITH DIFFERENTIAL/PLATELET
Abs Immature Granulocytes: 0.01 10*3/uL (ref 0.00–0.07)
Basophils Absolute: 0 10*3/uL (ref 0.0–0.1)
Basophils Relative: 0 %
Eosinophils Absolute: 0 10*3/uL (ref 0.0–0.5)
Eosinophils Relative: 1 %
HCT: 39.2 % (ref 36.0–46.0)
Hemoglobin: 13.4 g/dL (ref 12.0–15.0)
Immature Granulocytes: 0 %
Lymphocytes Relative: 36 %
Lymphs Abs: 1.2 10*3/uL (ref 0.7–4.0)
MCH: 28.9 pg (ref 26.0–34.0)
MCHC: 34.2 g/dL (ref 30.0–36.0)
MCV: 84.5 fL (ref 80.0–100.0)
Monocytes Absolute: 0.4 10*3/uL (ref 0.1–1.0)
Monocytes Relative: 14 %
Neutro Abs: 1.6 10*3/uL — ABNORMAL LOW (ref 1.7–7.7)
Neutrophils Relative %: 49 %
Platelets: 182 10*3/uL (ref 150–400)
RBC: 4.64 MIL/uL (ref 3.87–5.11)
RDW: 12.9 % (ref 11.5–15.5)
WBC: 3.2 10*3/uL — ABNORMAL LOW (ref 4.0–10.5)
nRBC: 0 % (ref 0.0–0.2)

## 2020-02-18 LAB — BASIC METABOLIC PANEL
Anion gap: 9 (ref 5–15)
BUN: 11 mg/dL (ref 6–20)
CO2: 25 mmol/L (ref 22–32)
Calcium: 8.7 mg/dL — ABNORMAL LOW (ref 8.9–10.3)
Chloride: 101 mmol/L (ref 98–111)
Creatinine, Ser: 0.64 mg/dL (ref 0.44–1.00)
GFR, Estimated: >60 mL/min (ref >60)
Glucose, Bld: 91 mg/dL (ref 70–99)
Potassium: 3.4 mmol/L — ABNORMAL LOW (ref 3.5–5.1)
Sodium: 135 mmol/L (ref 135–145)

## 2020-02-18 LAB — NOVEL CORONAVIRUS, NAA: SARS-CoV-2, NAA: DETECTED — AB

## 2020-02-18 LAB — SPECIMEN STATUS REPORT

## 2020-02-18 LAB — SARS-COV-2, NAA 2 DAY TAT

## 2020-02-18 MED ORDER — SODIUM CHLORIDE 0.9 % IV BOLUS
1000.0000 mL | Freq: Once | INTRAVENOUS | Status: AC
  Administered 2020-02-18: 1000 mL via INTRAVENOUS

## 2020-02-18 NOTE — ED Triage Notes (Signed)
Reports nausea 10/22.  She is currently nine weeks pregnant.  Reports everyone in the home has been sick for the last week some with GI symptoms some with Respiratory symptoms and some with both.  Took a rapid test at home that was positive for covid.  Had PCR done at [facility] testing center still waiting for results.  Now c/o ringing in ears, seeing spots, HR fluctuating from 80-113.  Cold and sweaty,  Also feels like she's going to pass out.  Does endorse needing fluids frequently during pregnancy.  This is 9th pregnancy.

## 2020-02-18 NOTE — ED Provider Notes (Signed)
[facility] EMERGENCY DEPARTMENT Provider Note   CSN: [id number] Arrival date & time: 02/18/20  1800     History Chief Complaint  Patient presents with  . Fever  . Nausea    Emily Bean is a 34 y.o. female.  Patient here for IV fluids.  Currently [redacted] weeks pregnant.  Tested positive for Covid yesterday on home test.  Has confirmatory testing already in process.  States that she gets hyperemesis with pregnancy and not having much nausea at this time but feels like she needs IV fluids.  Denies any abdominal pain, vaginal bleeding, shortness of breath, cough.  Overall she states that she is feeling better from a respiratory standpoint today.  The history is provided by the patient.  Illness Severity:  Mild Associated symptoms: cough, fever and nausea   Associated symptoms: no abdominal pain, no chest pain, no ear pain, no rash, no shortness of breath, no sore throat and no vomiting        Past Medical History:  Diagnosis Date  . Hypotension     Patient Active Problem List   Diagnosis Date Noted  . Viral gastroenteritis 01/29/2012  . Headache in pregnancy 01/29/2012    Past Surgical History:  Procedure Laterality Date  . DILATION AND CURETTAGE OF UTERUS       OB History    Gravida  5   Para  2   Term  2   Preterm      AB  2   Living  2     SAB  2   TAB      Ectopic      Multiple      Live Births              No family history on file.  Social History   Tobacco Use  . Smoking status: Never Smoker  Substance Use Topics  . Alcohol use: No  . Drug use: No    Home Medications Prior to Admission medications   Medication Sig Start Date End Date Taking? Authorizing Provider  busPIRone (BUSPAR) 5 MG tablet Take by mouth. 10/30/19   [provider]  meclizine (ANTIVERT) 25 MG tablet Take 12.5 mg by mouth daily as needed. For dizziness    [provider]  methylPREDNISolone (MEDROL) 4 MG tablet Take as directed  11/22/19   Kirtland Bouchard, PA-C    Allergies    Latex, Procardia [nifedipine], and Vicodin [hydrocodone-acetaminophen]  Review of Systems   Review of Systems  Constitutional: Positive for fever. Negative for chills.  HENT: Negative for ear pain and sore throat.   Eyes: Negative for pain and visual disturbance.  Respiratory: Positive for cough. Negative for shortness of breath.   Cardiovascular: Negative for chest pain and palpitations.  Gastrointestinal: Positive for nausea. Negative for abdominal pain and vomiting.  Genitourinary: Negative for dysuria and hematuria.  Musculoskeletal: Negative for arthralgias and back pain.  Skin: Negative for color change and rash.  Neurological: Negative for seizures and syncope.  All other systems reviewed and are negative.   Physical Exam Updated Vital Signs  ED Triage Vitals  Enc Vitals Group     BP 02/18/20 1811 (!) 122/102     Pulse Rate 02/18/20 1811 88     Resp 02/18/20 1811 18     Temp 02/18/20 1811 98.4 F (36.9 C)     Temp Source 02/18/20 1811 Oral     SpO2 02/18/20 1811 100 %  Weight 02/18/20 1813 160 lb (72.6 kg)     Height 02/18/20 1813 5\' 2"  (1.575 m)     Head Circumference --      Peak Flow --      Pain Score 02/18/20 1811 0     Pain Loc --      Pain Edu? --      Excl. in GC? --     Physical Exam Vitals and nursing note reviewed.  Constitutional:      General: She is not in acute distress.    Appearance: She is well-developed. She is not ill-appearing.  HENT:     Head: Normocephalic and atraumatic.     Nose: Nose normal.     Mouth/Throat:     Mouth: Mucous membranes are moist.  Eyes:     Extraocular Movements: Extraocular movements intact.     Conjunctiva/sclera: Conjunctivae normal.     Pupils: Pupils are equal, round, and reactive to light.  Cardiovascular:     Rate and Rhythm: Normal rate and regular rhythm.     Pulses: Normal pulses.     Heart sounds: Normal heart sounds. No murmur heard.    Pulmonary:     Effort: Pulmonary effort is normal. No respiratory distress.     Breath sounds: Normal breath sounds.  Abdominal:     General: There is no distension.     Palpations: Abdomen is soft.     Tenderness: There is no abdominal tenderness.  Musculoskeletal:     Cervical back: Normal range of motion and neck supple.  Skin:    General: Skin is warm and dry.     Capillary Refill: Capillary refill takes less than 2 seconds.  Neurological:     General: No focal deficit present.     Mental Status: She is alert.  Psychiatric:        Mood and Affect: Mood normal.     ED Results / Procedures / Treatments   Labs (all labs ordered are listed, but only abnormal results are displayed) Labs Reviewed  CBC WITH DIFFERENTIAL/PLATELET - Abnormal; Notable for the following components:      Result Value   WBC 3.2 (*)    Neutro Abs 1.6 (*)    All other components within normal limits  BASIC METABOLIC PANEL - Abnormal; Notable for the following components:   Potassium 3.4 (*)    Calcium 8.7 (*)    All other components within normal limits    EKG None  Radiology No results found.  Procedures Procedures (including critical care time)  Medications Ordered in ED Medications  sodium chloride 0.9 % bolus 1,000 mL (1,000 mLs Intravenous New Bag/Given 02/18/20 1843)    ED Course  I have reviewed the triage vital signs and the nursing notes.  Pertinent labs & imaging results that were available during my care of the patient were reviewed by me and considered in my medical decision making (see chart for details).    MDM Rules/Calculators/A&P                          Emily Bean is a 34 year old female who presents to the ED with concern for dehydration.  Normal vitals.  No fever.  Diagnosed with Covid yesterday.  Similar symptoms in the household as well.  Currently [redacted] weeks pregnant but denies any abdominal pain or vaginal bleeding.  Overall she appears well and states  that she is feeling better than she did  yesterday but would like IV fluids as she gets dehydrated in pregnancy pretty easily as she does develop hyperemesis in her first trimester is in the past.  She has been pregnant 9 times before.  Overall she has normal room air oxygenation both at rest and with ambulation.  She has clear breath sounds.  Lab work overall unremarkable.  Was given IV fluids and discharged in ED in good condition.  Given return precautions.  This chart was dictated using voice recognition software.  Despite best efforts to proofread,  errors can occur which can change the documentation meaning.  RANESSA KOSTA was evaluated in Emergency Department on 02/18/2020 for the symptoms described in the history of present illness. She was evaluated in the context of the global COVID-19 pandemic, which necessitated consideration that the patient might be at risk for infection with the SARS-CoV-2 virus that causes COVID-19. Institutional protocols and algorithms that pertain to the evaluation of patients at risk for COVID-19 are in a state of rapid change based on information released by regulatory bodies including the CDC and federal and state organizations. These policies and algorithms were followed during the patient's care in the ED.   Final Clinical Impression(s) / ED Diagnoses Final diagnoses:  Upper respiratory tract infection, unspecified type    Rx / DC Orders ED Discharge Orders    None       Virgina Norfolk, DO 02/18/20 1958

## 2020-02-18 NOTE — Discharge Instructions (Addendum)
Call Covid clinic and see if you are a candidate for antibody infusion.

## 2020-02-19 ENCOUNTER — Encounter: Payer: Self-pay | Admitting: Nurse Practitioner

## 2020-02-19 ENCOUNTER — Telehealth: Payer: Self-pay | Admitting: Nurse Practitioner

## 2020-02-19 ENCOUNTER — Ambulatory Visit (HOSPITAL_COMMUNITY)
Admission: RE | Admit: 2020-02-19 | Discharge: 2020-02-19 | Disposition: A | Discharge status: Home / Self Care | Payer: HRSA Program | Source: Ambulatory Visit | Attending: Pulmonary Disease | Admitting: Pulmonary Disease

## 2020-02-19 ENCOUNTER — Other Ambulatory Visit: Payer: Self-pay | Admitting: Nurse Practitioner

## 2020-02-19 DIAGNOSIS — U071 COVID-19: Secondary | ICD-10-CM | POA: Insufficient documentation

## 2020-02-19 DIAGNOSIS — O98519 Other viral diseases complicating pregnancy, unspecified trimester: Secondary | ICD-10-CM | POA: Diagnosis not present

## 2020-02-19 DIAGNOSIS — Z3A00 Weeks of gestation of pregnancy not specified: Secondary | ICD-10-CM | POA: Diagnosis not present

## 2020-02-19 MED ORDER — DIPHENHYDRAMINE HCL 50 MG/ML IJ SOLN: 50.0000 mg | Freq: Once | INTRAMUSCULAR | Status: DC | PRN

## 2020-02-19 MED ORDER — SODIUM CHLORIDE 0.9 % IV SOLN: INTRAVENOUS | Status: AC

## 2020-02-19 MED ORDER — SODIUM CHLORIDE 0.9 % IV BOLUS
1000.0000 mL | Freq: Once | INTRAVENOUS | Status: AC
  Administered 2020-02-19: 1000 mL via INTRAVENOUS

## 2020-02-19 MED ORDER — ONDANSETRON HCL 4 MG/2ML IJ SOLN
4.0000 mg | Freq: Once | INTRAMUSCULAR | Status: AC
  Administered 2020-02-19: 4 mg via INTRAVENOUS

## 2020-02-19 MED ORDER — SODIUM CHLORIDE 0.9 % IV SOLN: INTRAVENOUS | Status: DC | PRN

## 2020-02-19 MED ORDER — FAMOTIDINE IN NACL 20-0.9 MG/50ML-% IV SOLN: 20.0000 mg | Freq: Once | INTRAVENOUS | Status: DC | PRN

## 2020-02-19 MED ORDER — METHYLPREDNISOLONE SODIUM SUCC 125 MG IJ SOLR: 125.0000 mg | Freq: Once | INTRAMUSCULAR | Status: DC | PRN

## 2020-02-19 MED ORDER — ALBUTEROL SULFATE HFA 108 (90 BASE) MCG/ACT IN AERS: 2.0000 | INHALATION_SPRAY | Freq: Once | RESPIRATORY_TRACT | Status: DC | PRN

## 2020-02-19 MED ORDER — EPINEPHRINE 0.3 MG/0.3ML IJ SOAJ: 0.3000 mg | Freq: Once | INTRAMUSCULAR | Status: DC | PRN

## 2020-02-19 MED ORDER — SOTROVIMAB 500 MG/8ML IV SOLN
500.0000 mg | Freq: Once | INTRAVENOUS | Status: AC
  Administered 2020-02-19: 500 mg via INTRAVENOUS

## 2020-02-19 MED ORDER — ONDANSETRON HCL 4 MG/2ML IJ SOLN
INTRAMUSCULAR | Status: AC
  Filled 2020-02-19: qty 2

## 2020-02-19 NOTE — Discharge Instructions (Signed)

## 2020-02-19 NOTE — Progress Notes (Signed)
I connected by phone with Emily Bean on 02/19/2020 at 11:28 AM to discuss the potential use of a treatment for mild to moderate COVID-19 viral infection in non-hospitalized patients.  This patient is a 34 y.o. female that meets the FDA criteria for Emergency Use Authorization of bamlanivimab/etesevimab, casirivimab\imdevimab, or sotrovimab  Has a (+) direct SARS-CoV-2 viral test result  Has mild or moderate COVID-19   Is ? 36 years of age and weighs ? 40 kg  Is NOT hospitalized due to COVID-19  Is NOT requiring oxygen therapy or requiring an increase in baseline oxygen flow rate due to COVID-19  Is within 10 days of symptom onset  Has at least one of the high risk factor(s) for progression to severe COVID-19 and/or hospitalization as defined in EUA.  Specific high risk criteria : Pregnancy   Onset 10/22.    I have spoken and communicated the following to the patient or parent/caregiver:  1. FDA has authorized the emergency use of bamlanivimab/etesevimab, casirivimab\imdevimab, or sotrovimab for the treatment of mild to moderate COVID-19 in adults and pediatric patients with positive results of direct SARS-CoV-2 viral testing who are 51 years of age and older weighing at least 40 kg, and who are at high risk for progressing to severe COVID-19 and/or hospitalization.  2. The significant known and potential risks and benefits of bamlanivimab/etesevimab, casirivimab\imdevimab, or sotrovimab, and the extent to which such potential risks and benefits are unknown.  3. Information on available alternative treatments and the risks and benefits of those alternatives, including clinical trials.  4. Patients treated with bamlanivimab/etesevimab, casirivimab\imdevimab, or sotrovimab should continue to self-isolate and use infection control measures (e.g., wear mask, isolate, social distance, avoid sharing personal items, clean and disinfect "high touch" surfaces, and frequent handwashing)  according to CDC guidelines.   5. The patient or parent/caregiver has the option to accept or refuse bamlanivimab/etesevimab, casirivimab\imdevimab, or sotrovimab.  After reviewing this information with the patient, the patient has agreed to receive one of the available covid 19 monoclonal antibodies and will be provided an appropriate fact sheet prior to infusion.Consuello Masse, DNP, AGNP-C [phone] (Infusion Center Hotline)

## 2020-02-19 NOTE — Progress Notes (Addendum)
  Diagnosis: COVID-19  Physician: Dr. Shan Levans  Procedure: Sotrovimab infusion  Complications: No immediate complications noted.  Discharge: Discharged home   Essie Hart 02/19/2020

## 2020-02-19 NOTE — Telephone Encounter (Signed)
Called to Discuss with patient about Covid symptoms and the use of a monoclonal antibody infusion for those with mild to moderate Covid symptoms and at a high risk of hospitalization.     Pt is qualified for this infusion at the  infusion center due to co-morbid conditions and/or a member of an at-risk group.     Unable to reach pt. Left message to return call. Sent mychart message.   Sokhna Christoph, DNP, AGNP-C [phone] (Infusion Center Hotline)  

## 2020-04-02 ENCOUNTER — Other Ambulatory Visit: Payer: Self-pay

## 2020-12-26 ENCOUNTER — Telehealth: Payer: Self-pay | Admitting: Orthopaedic Surgery

## 2020-12-26 NOTE — Telephone Encounter (Signed)
Patient aware of the below message  She wanted to make sure to let us know she has 7 kids and was absolutely not looking for medications, she just wanted guidance with therapy/pain she was having

## 2020-12-26 NOTE — Telephone Encounter (Signed)
Pt calling and was previously seen by Dr. Magnus Ivan last year. Is still going to therapy to help with issue but is still having daily discomfort and pain especially after P.T. Pt does have an upcoming appt with Dr. Magnus Ivan and wanted to know if there was anything she can do to help with the pain and discomfort prior to her appt. Pt is 3 months post partum so is not sure about medications to help. The best call back number is [phone].

## 2021-01-13 ENCOUNTER — Ambulatory Visit: Payer: Self-pay | Admitting: Orthopaedic Surgery

## 2021-01-15 ENCOUNTER — Other Ambulatory Visit: Payer: Self-pay

## 2021-01-15 ENCOUNTER — Ambulatory Visit (INDEPENDENT_AMBULATORY_CARE_PROVIDER_SITE_OTHER): Payer: Self-pay | Admitting: Orthopaedic Surgery

## 2021-01-15 ENCOUNTER — Encounter: Payer: Self-pay | Admitting: Orthopaedic Surgery

## 2021-01-15 DIAGNOSIS — M25562 Pain in left knee: Secondary | ICD-10-CM

## 2021-01-15 DIAGNOSIS — G8929 Other chronic pain: Secondary | ICD-10-CM

## 2021-01-15 MED ORDER — LIDOCAINE HCL 1 % IJ SOLN
3.0000 mL | INTRAMUSCULAR | Status: AC | PRN
  Administered 2021-01-15: 3 mL

## 2021-01-15 MED ORDER — METHYLPREDNISOLONE ACETATE 40 MG/ML IJ SUSP
40.0000 mg | INTRAMUSCULAR | Status: AC | PRN
  Administered 2021-01-15: 40 mg via INTRA_ARTICULAR

## 2021-01-15 NOTE — Progress Notes (Signed)
Office Visit Note   Patient: Emily Bean           Date of Birth: [birth year]           MRN: [id number] Visit Date: 01/15/2021              Requested by: Porfirio Oar, PA [address] [place],  [place] [address] PCP: Porfirio Oar, PA   Assessment & Plan: Visit Diagnoses:  1. Chronic pain of left knee     Plan: Given the severity of her continued knee pain with locking and catching, I would like to obtain a repeat MRI of the left knee since has been 15 months and that knee MRI did show a contusion with edema in the lateral posterior femoral condyle.  I did place a steroid injection in her left knee today which she tolerated well.  I would like to see her back after the MRI.  She will work on IT band stretching exercises and try some Voltaren gel as well.  All questions and concerns were answered and addressed.  Follow-Up Instructions: No follow-ups on file.   Orders:  Orders Placed This Encounter  Procedures   Large Joint Inj   No orders of the defined types were placed in this encounter.     Procedures: Large Joint Inj: L knee on 01/15/2021 3:12 PM Indications: diagnostic evaluation and pain Details: 22 G 1.5 in needle, superolateral approach  Arthrogram: No  Medications: 3 mL lidocaine 1 %; 40 mg methylPREDNISolone acetate 40 MG/ML Outcome: tolerated well, no immediate complications Procedure, treatment alternatives, risks and benefits explained, specific risks discussed. Consent was given by the patient. Immediately prior to procedure a time out was called to verify the correct patient, procedure, equipment, support staff and site/side marked as required. Patient was prepped and draped in the usual sterile fashion.      Clinical Data: No additional findings.   Subjective: Chief Complaint  Patient presents with   Left Knee - Pain  The patient is very well-known to me.  She has been dealing with left chronic knee pain for some time now.   She has mechanical symptoms of locking and catching.  She hurts on the lateral aspect of her knee and around her patella as well.  She does get muscle spasms.  She has tried and failed all forms conservative treatment.  She has had adjustments to that knee by chiropractor.  She has had a therapy session and a previous steroid injection over a year ago which did not help.  A previous MRI of her left knee did show a contusion of the posterior aspect the lateral femoral condyle but no meniscal tear and intact cartilage in the.  This is become frustrating for her appropriately given the fact that it is not getting better and she is an athletic individual.  Her right knee is asymptomatic.  HPI  Review of Systems There is currently listed no headache, chest pain, shortness of breath, fever, chills, nausea, vomiting  Objective: Vital Signs: There were no vitals taken for this visit.  Physical Exam She is alert and orient x3 and in no acute distress Ortho Exam Examination of her left knee shows significant pain over the distal IT band.  There is a mild knee joint effusion.  Her range of motion is painful of the knee but the knee feels stable on my exam. Specialty Comments:  No specialty comments available.  Imaging: No results found.   PMFS  History: Patient Active Problem List   Diagnosis Date Noted   Viral gastroenteritis 01/29/2012   Headache in pregnancy 01/29/2012   Past Medical History:  Diagnosis Date   Hypotension     History reviewed. No pertinent family history.  Past Surgical History:  Procedure Laterality Date   DILATION AND CURETTAGE OF UTERUS     Social History   Occupational History   Not on file  Tobacco Use   Smoking status: Never   Smokeless tobacco: Not on file  Substance and Sexual Activity   Alcohol use: No   Drug use: No   Sexual activity: Yes

## 2021-01-15 NOTE — Addendum Note (Signed)
Addended by: Barbette Or on: 01/15/2021 04:40 PM   Modules accepted: Orders

## 2021-02-06 ENCOUNTER — Encounter: Payer: Self-pay | Admitting: *Deleted

## 2021-03-31 ENCOUNTER — Encounter: Payer: Self-pay | Admitting: Orthopedic Surgery

## 2021-03-31 ENCOUNTER — Ambulatory Visit: Payer: Self-pay | Admitting: Orthopedic Surgery

## 2021-03-31 ENCOUNTER — Other Ambulatory Visit: Payer: Self-pay

## 2021-03-31 ENCOUNTER — Ambulatory Visit (INDEPENDENT_AMBULATORY_CARE_PROVIDER_SITE_OTHER): Payer: Self-pay

## 2021-03-31 VITALS — BP 109/75 | HR 91

## 2021-03-31 DIAGNOSIS — M79644 Pain in right finger(s): Secondary | ICD-10-CM

## 2021-03-31 NOTE — Progress Notes (Signed)
Office Visit Note   Patient: Emily Bean           Date of Birth: [birth year]           MRN: [id number] Visit Date: 03/31/2021              Requested by: Porfirio Oar, PA [address] [place],  [place] [address] PCP: Porfirio Oar, PA   Assessment & Plan: Visit Diagnoses:  1. Finger pain, right     Plan: Reviewed x-rays of patient's right index finger which does not show any evidence of fracture or acute bony injury.  She likely injured the ulnar collateral ligament of the index finger PIP joint.  Reviewed the nature of these injuries and treatment strategies like Coban for edema control, early ROM, and NSAIDs.  She can follow up with me as needed.   Follow-Up Instructions: No follow-ups on file.   Orders:  Orders Placed This Encounter  Procedures   XR Finger Index Right   No orders of the defined types were placed in this encounter.     Procedures: No procedures performed   Clinical Data: No additional findings.   Subjective: Chief Complaint  Patient presents with   Right Index Finger - New Patient (Initial Visit)    This is a 35 year old right-hand-dominant female who presents with right index finger pain.  She was unpacking some Sort on 12/10 when she jammed this finger on the following box.  She has pain at the ulnar aspect of the index finger at the PIP joint.  She has moderate swelling.  She has full range of motion.  She has no subjective instability.   Review of Systems   Objective: Vital Signs: BP 109/75 (BP Location: Left Arm, Patient Position: Sitting, Cuff Size: Normal)   Pulse 91   SpO2 98%   Physical Exam Constitutional:      Appearance: Normal appearance.  Cardiovascular:     Rate and Rhythm: Normal rate.     Pulses: Normal pulses.  Pulmonary:     Effort: Pulmonary effort is normal.  Skin:    General: Skin is warm and dry.     Capillary Refill: Capillary refill takes less than 2 seconds.  Neurological:      Mental Status: She is alert.    Right Hand Exam   Tenderness  Right hand tenderness location: TTP at ulnar aspect of index finger PIP joint.  Other  Erythema: absent Sensation: normal Pulse: present  Comments:  Moderate swelling at index PIP joint.  Full ROM.  No instability with radial/ulnar deviation of index finger in flexion or extension.      Specialty Comments:  No specialty comments available.  Imaging: 3 views of the right index finger taken today reviewed interpreted by me.  They demonstrate concentric reduction of the joint.  No evidence of bony fracture.   PMFS History: Patient Active Problem List   Diagnosis Date Noted   Viral gastroenteritis 01/29/2012   Headache in pregnancy 01/29/2012   Past Medical History:  Diagnosis Date   Hypotension     History reviewed. No pertinent family history.  Past Surgical History:  Procedure Laterality Date   DILATION AND CURETTAGE OF UTERUS     Social History   Occupational History   Not on file  Tobacco Use   Smoking status: Never   Smokeless tobacco: Not on file  Substance and Sexual Activity   Alcohol use: No   Drug use: No  Sexual activity: Yes

## 2021-05-01 ENCOUNTER — Ambulatory Visit: Payer: Self-pay | Admitting: Orthopedic Surgery

## 2021-05-20 ENCOUNTER — Other Ambulatory Visit: Payer: Self-pay

## 2021-05-20 ENCOUNTER — Ambulatory Visit (INDEPENDENT_AMBULATORY_CARE_PROVIDER_SITE_OTHER): Payer: Self-pay | Admitting: Orthopedic Surgery

## 2021-05-20 DIAGNOSIS — M79644 Pain in right finger(s): Secondary | ICD-10-CM

## 2021-05-28 ENCOUNTER — Encounter: Payer: Self-pay | Admitting: Rehabilitative and Restorative Service Providers"

## 2021-06-08 NOTE — Progress Notes (Signed)
° °  Office Visit Note   Patient: Emily Bean           Date of Birth: [birth year]           MRN: [id number] Visit Date: 05/20/2021              Requested by: Porfirio Oar, PA [address] [place],  [place] [address] PCP: Porfirio Oar, PA   Assessment & Plan: Visit Diagnoses:  1. Finger pain, right     Plan: Patient with continued pain in right index finger.  No instability at PIP joint.  We will try hand therapy to work on strengthening and pain modalities.  She can continue to take oral NSAIDs.  I can see her back if she fails to respond to therapy.   Follow-Up Instructions: No follow-ups on file.   Orders:  Orders Placed This Encounter  Procedures   Ambulatory referral to Occupational Therapy   No orders of the defined types were placed in this encounter.     Procedures: No procedures performed   Clinical Data: No additional findings.   Subjective: Chief Complaint  Patient presents with   Right Index Finger - Follow-up    This is a 36 yo F who presents for follow up of right index pain at the PIP joint.  She was last seen on 1/31 at which time the plan was to use Coban for edema control, early ROM, and NSAIDs.  She is a mother of 7 and is still having difficulty with this finger with certain activities.  One example is buttoning one of her kid's shirts.  The pain remains localized to the ulnar aspect of the index PIP joint.    Review of Systems   Objective: Vital Signs: There were no vitals taken for this visit.  Physical Exam Constitutional:      Appearance: Normal appearance.  Cardiovascular:     Rate and Rhythm: Normal rate.     Pulses: Normal pulses.  Pulmonary:     Effort: Pulmonary effort is normal.  Skin:    General: Skin is warm and dry.     Capillary Refill: Capillary refill takes less than 2 seconds.  Neurological:     Mental Status: She is alert.    Right Hand Exam   Tenderness  Right hand tenderness  location: TTP at ulnar aspect of index PIP joint.  Other  Erythema: absent Sensation: normal Pulse: present  Comments:  Continued index finger PIP swelling.  Full ROM.  No instability.      Specialty Comments:  No specialty comments available.  Imaging: No results found.   PMFS History: Patient Active Problem List   Diagnosis Date Noted   Viral gastroenteritis 01/29/2012   Headache in pregnancy 01/29/2012   Past Medical History:  Diagnosis Date   Hypotension     No family history on file.  Past Surgical History:  Procedure Laterality Date   DILATION AND CURETTAGE OF UTERUS     Social History   Occupational History   Not on file  Tobacco Use   Smoking status: Never   Smokeless tobacco: Not on file  Substance and Sexual Activity   Alcohol use: No   Drug use: No   Sexual activity: Yes

## 2021-07-28 ENCOUNTER — Other Ambulatory Visit (HOSPITAL_COMMUNITY): Payer: Self-pay | Admitting: Orthopedic Surgery

## 2021-08-14 ENCOUNTER — Ambulatory Visit (HOSPITAL_BASED_OUTPATIENT_CLINIC_OR_DEPARTMENT_OTHER): Admit: 2021-08-14 | Payer: Self-pay | Admitting: Orthopedic Surgery

## 2021-08-14 ENCOUNTER — Encounter (HOSPITAL_BASED_OUTPATIENT_CLINIC_OR_DEPARTMENT_OTHER): Payer: Self-pay

## 2021-08-14 SURGERY — BUNIONECTOMY
Anesthesia: General | Site: Toe | Laterality: Left

## 2021-09-03 ENCOUNTER — Other Ambulatory Visit (HOSPITAL_COMMUNITY): Payer: Self-pay | Admitting: Orthopedic Surgery

## 2021-09-18 ENCOUNTER — Ambulatory Visit (HOSPITAL_BASED_OUTPATIENT_CLINIC_OR_DEPARTMENT_OTHER): Admit: 2021-09-18 | Payer: Self-pay | Admitting: Orthopedic Surgery

## 2021-09-18 ENCOUNTER — Encounter (HOSPITAL_BASED_OUTPATIENT_CLINIC_OR_DEPARTMENT_OTHER): Payer: Self-pay

## 2021-09-18 SURGERY — BUNIONECTOMY, AKIN
Anesthesia: General | Site: Toe | Laterality: Left

## 2021-11-02 IMAGING — MR MR KNEE*L* W/O CM
6 series · 38 of 40 positions shown · non-contrast
Comparison: Radiographs 11/07/2019

CLINICAL DATA: History of recent falls.  Knee pain.

EXAM:
MRI OF THE LEFT KNEE WITHOUT CONTRAST
TECHNIQUE: Multiplanar, multisequence MR imaging of the knee was performed. No
intravenous contrast was administered.

[Series 6: T2 fat-sat · axial · left · 4.0mm · 0.50mm/px · z∈[-88,+65]mm · 8 of 36 slices shown (1 of 3)]
[im 1/36]
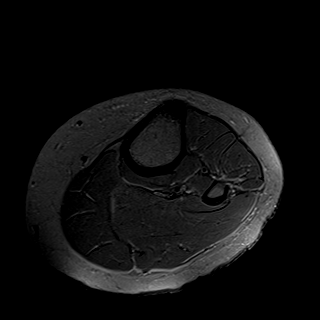
[im 6/36]
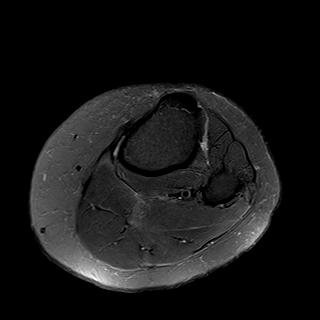
[im 11/36]
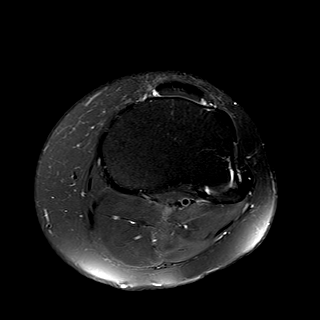
[im 16/36]
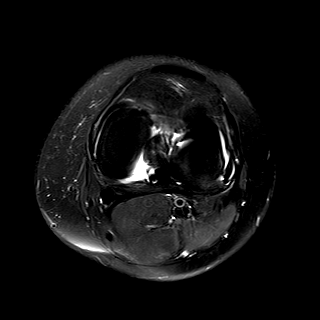
[im 21/36]
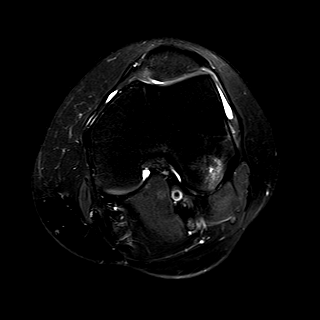
[im 26/36]
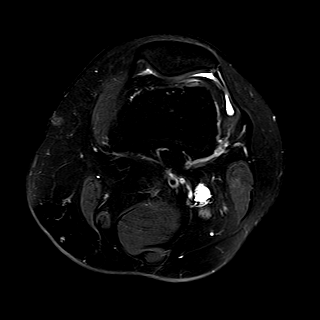
[im 31/36]
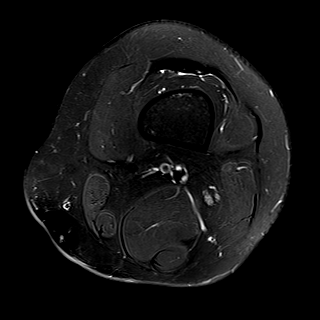
[im 36/36]
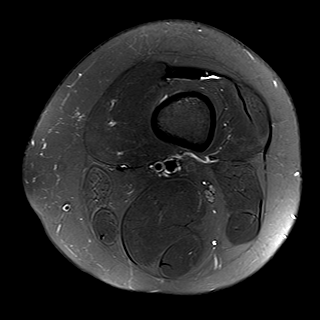

[Series 7: T2 fat-sat · coronal · left · 4.0mm · 0.39mm/px · 5 of 23 slices shown (2 of 3)]
[im 1/23]
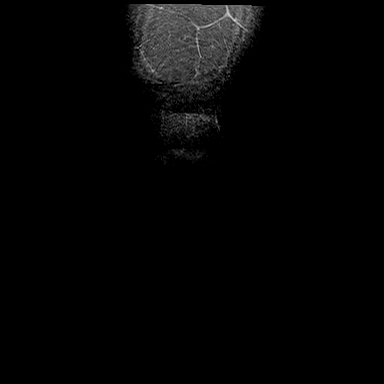
[im 6/23]
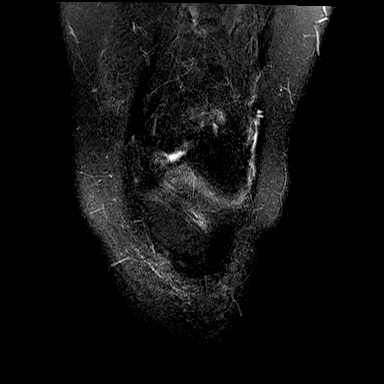
[im 12/23]
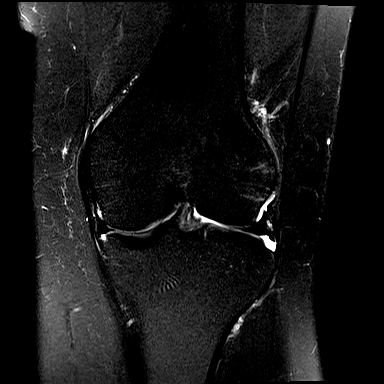
[im 17/23]
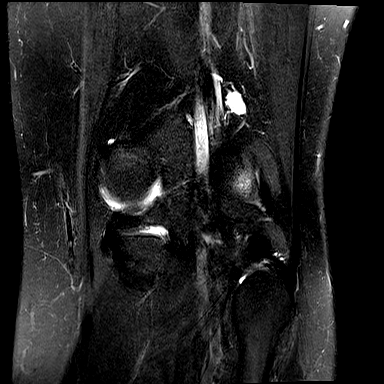
[im 23/23]
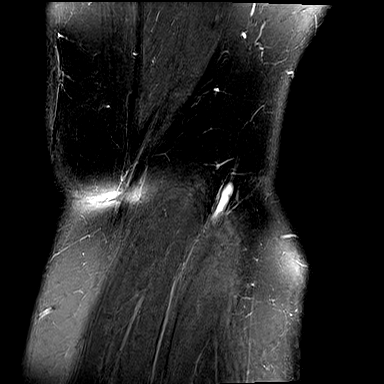

[Series 8: T1 · coronal · left · 4.0mm · 0.39mm/px · 4 of 23 slices shown]
[im 1/23]
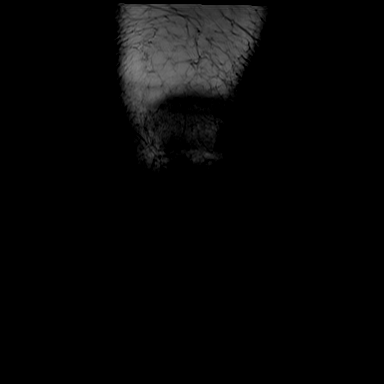
[im 5/23]
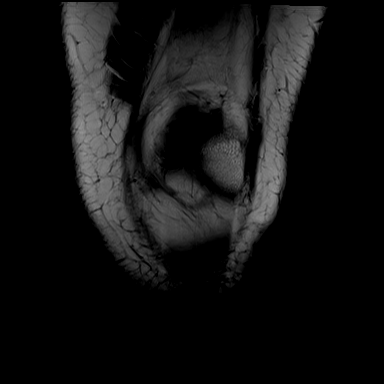
[im 9/23]
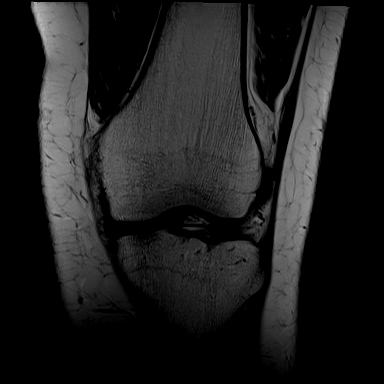
[im 14/23]
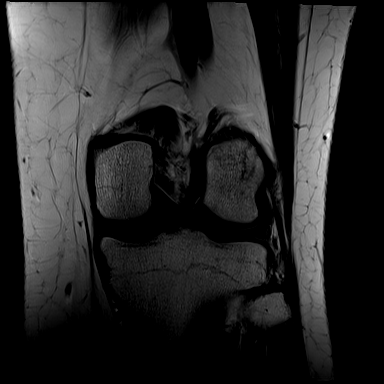

[Series 9: PD fat-sat · coronal · left · 3.0mm · 0.47mm/px · 7 of 28 slices shown (1 of 2)]
[im 1/28]
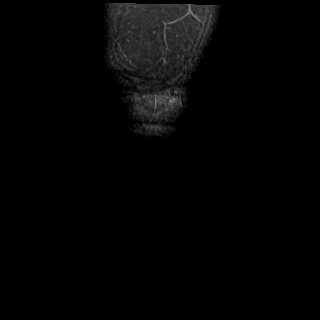
[im 5/28]
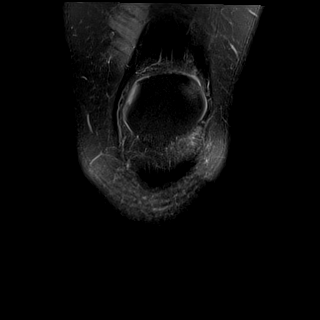
[im 10/28]
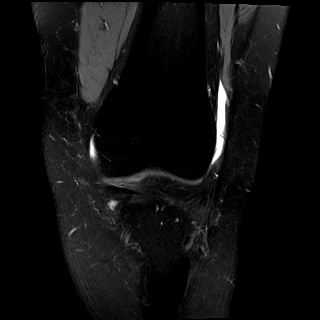
[im 14/28]
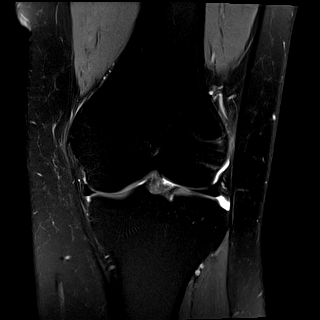
[im 19/28]
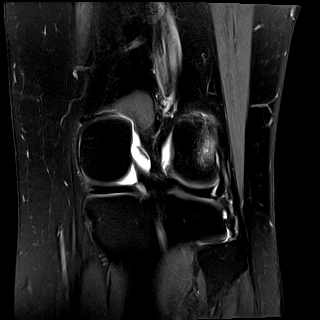
[im 23/28]
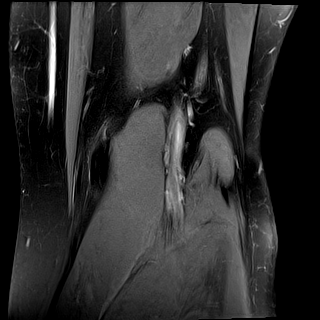
[im 28/28]
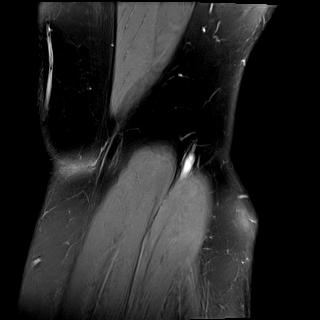

[Series 10: PD fat-sat · sagittal · left · 3.0mm · 0.39mm/px · 7 of 27 slices shown (2 of 2)]
[im 1/27]
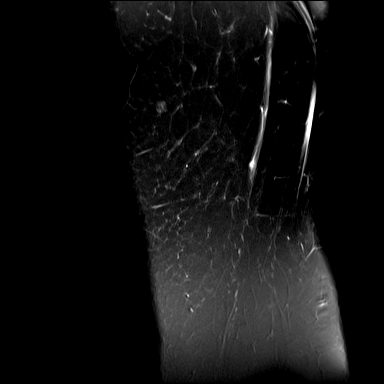
[im 5/27]
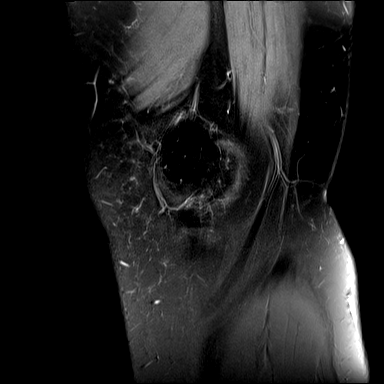
[im 9/27]
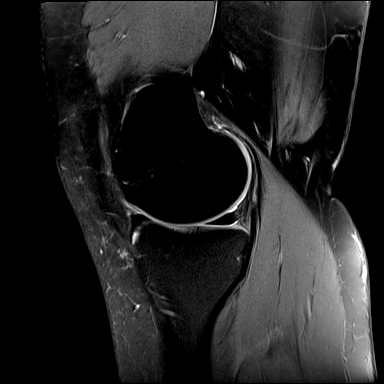
[im 14/27]
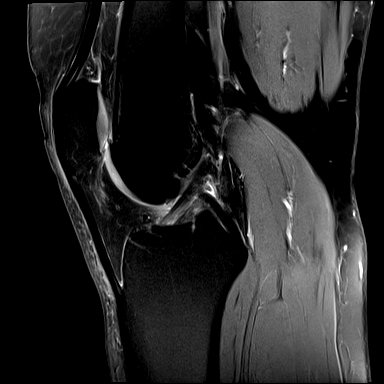
[im 18/27]
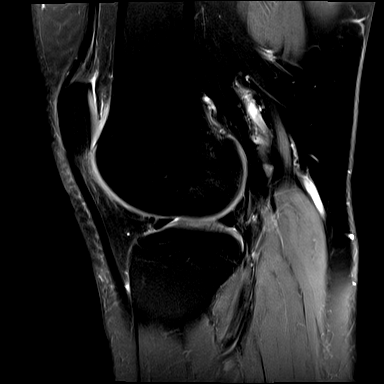
[im 22/27]
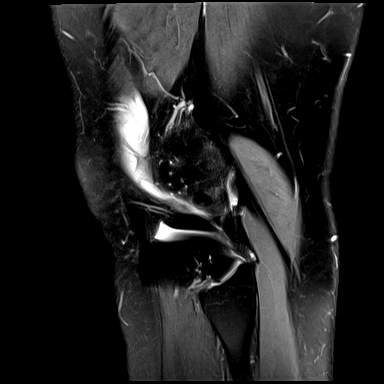
[im 27/27]
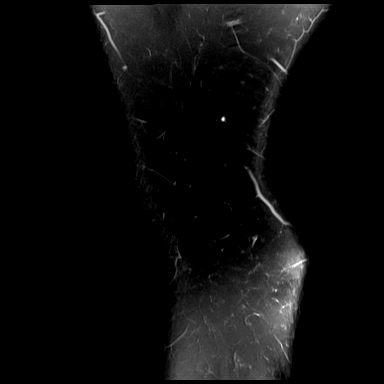

[Series 11: T2 fat-sat · sagittal · left · 3.0mm · 0.39mm/px · 7 of 27 slices shown (3 of 3)]
[im 1/27]
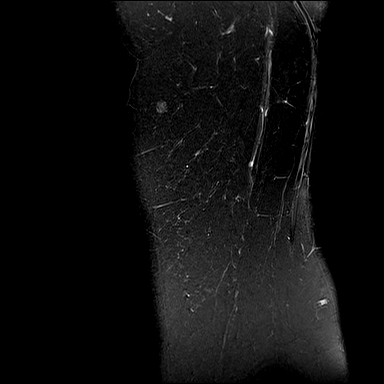
[im 5/27]
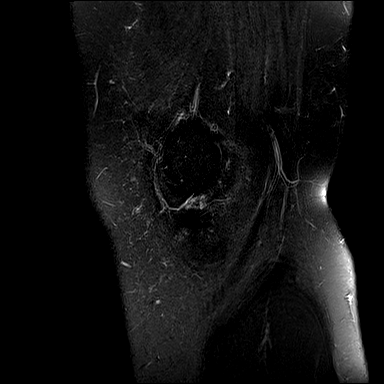
[im 9/27]
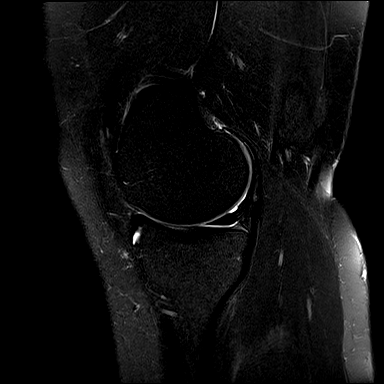
[im 14/27]
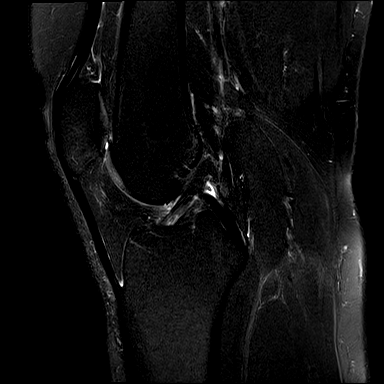
[im 18/27]
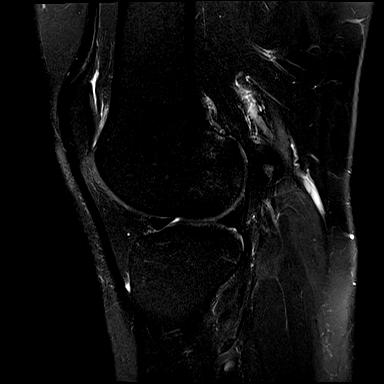
[im 22/27]
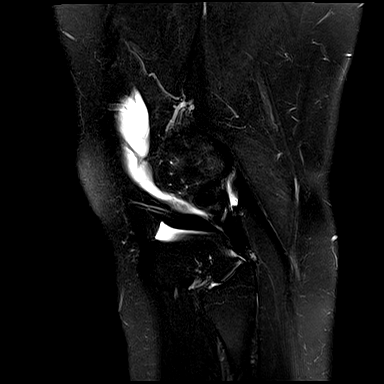
[im 27/27]
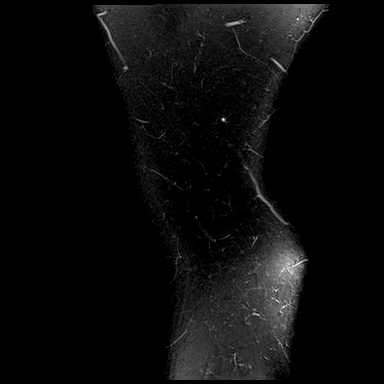

[38 of 40 positions shown; findings below may reference images not displayed]

FINDINGS: MENISCI

Medial meniscus:  Intact

Lateral meniscus:  Intact

LIGAMENTS

Cruciates:  Intact

Collaterals:  Intact

CARTILAGE

Patellofemoral: Mild degenerative chondrosis but no cartilage
defects or osteochondral lesion.

Medial:  Normal

Lateral:  Normal

Joint: Small amount of joint fluid but no overt joint effusion. Mild
edema/inflammation and Hoffa's fat.

Popliteal Fossa:  No popliteal mass or Baker's cyst.

Extensor Mechanism: The patella retinacular structures are intact
and the quadriceps and patellar tendons are intact.

Bones: There is a focal area of edema like signal abnormality in the
posterior aspect of the lateral femoral condyle. This could be a
focal bone contusion. I do not see any overlying cartilage
abnormality.

Other: Unremarkable knee musculature.
IMPRESSION: 1. Intact ligamentous structures and no meniscal tears.
2. Mild chondromalacia patella but no cartilage defects or
osteochondral lesion.
3. Focal area of edema like signal abnormality in the posterior
aspect of the lateral femoral condyle which could be a focal bone
contusion.
4. No joint effusion or Baker's cyst.

## 2022-01-12 ENCOUNTER — Other Ambulatory Visit: Payer: Self-pay | Admitting: Physician Assistant

## 2022-01-12 DIAGNOSIS — G8929 Other chronic pain: Secondary | ICD-10-CM

## 2022-06-25 ENCOUNTER — Encounter: Payer: Self-pay | Admitting: Radiology
